# Patient Record
Sex: Male | Born: 1965 | Race: White | Hispanic: No | Marital: Married
Health system: Southern US, Community
[De-identification: ages and names within clinical notes are randomized; demographics above are authoritative.]

---

## 2007-06-19 ENCOUNTER — Ambulatory Visit (HOSPITAL_COMMUNITY): Admission: RE | Admit: 2007-06-19 | Discharge: 2007-06-19 | Payer: Self-pay | Admitting: Internal Medicine

## 2014-02-04 ENCOUNTER — Other Ambulatory Visit (HOSPITAL_COMMUNITY): Payer: Self-pay

## 2014-02-04 DIAGNOSIS — G4730 Sleep apnea, unspecified: Secondary | ICD-10-CM

## 2014-02-15 ENCOUNTER — Ambulatory Visit: Payer: PRIVATE HEALTH INSURANCE | Attending: Physician Assistant | Admitting: Sleep Medicine

## 2014-02-15 VITALS — Ht 73.0 in | Wt 201.0 lb

## 2014-02-15 DIAGNOSIS — G4710 Hypersomnia, unspecified: Secondary | ICD-10-CM | POA: Diagnosis present

## 2014-02-15 DIAGNOSIS — G4733 Obstructive sleep apnea (adult) (pediatric): Secondary | ICD-10-CM | POA: Diagnosis not present

## 2014-02-15 DIAGNOSIS — G4730 Sleep apnea, unspecified: Secondary | ICD-10-CM

## 2014-02-16 NOTE — Sleep Study (Signed)
  [facility] Richard Bowers A. Gerilyn Pilgrim, MD     [email or web address]        NOCTURNAL POLYSOMNOGRAM    LOCATION: SLEEP LAB FACILITY: [facility]   PHYSICIAN: Labrenda Lasky A. Gerilyn Pilgrim, M.D.   DATE OF STUDY: 02/15/2014.   REFERRING PHYSICIAN: Oletta Darter, PA-C.  INDICATIONS: This is a 48 year old presents with hypersomnia, fatigue and snoring.  MEDICATIONS:  Prior to Admission medications   Not on File      EPWORTH SLEEPINESS SCALE: 10.   BMI: 27.   ARCHITECTURAL SUMMARY: Total recording time was 398 minutes. Sleep efficiency 83 %. Sleep latency 31 minutes. REM latency 109 minutes. Stage NI 4 %, N2 60 % and N3 20 % and REM sleep 17 %.    RESPIRATORY DATA:  Baseline oxygen saturation is 96 %. The lowest saturation is 79 %. The diagnostic AHI is 18. The RDI is 18. The REM AHI is 6. The supine AHI is 39.  LIMB MOVEMENT SUMMARY: PLM index 0.   ELECTROCARDIOGRAM SUMMARY: Average heart rate is 58 with no significant dysrhythmias observed.   IMPRESSION:  1. Moderate mostly supine related obstructive sleep apnea syndrome. Formal CPAP titration recording is suggested.  Thanks for this referral.  [name] A. Gerilyn Pilgrim, M.D. Diplomat, Biomedical engineer of Sleep Medicine.
